# Patient Record
Sex: Female | Born: 2000 | Hispanic: Yes | Marital: Single | Smoking: Never smoker
Health system: Southern US, Community
[De-identification: ages and names within clinical notes are randomized; demographics above are authoritative.]

## PROBLEM LIST (undated history)

## (undated) DIAGNOSIS — D649 Anemia, unspecified: Secondary | ICD-10-CM

## (undated) DIAGNOSIS — Z789 Other specified health status: Secondary | ICD-10-CM

## (undated) HISTORY — PX: APPENDECTOMY: SHX54

## (undated) HISTORY — DX: Other specified health status: Z78.9

---
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
## 2021-03-16 ENCOUNTER — Other Ambulatory Visit: Payer: Self-pay

## 2021-03-16 ENCOUNTER — Ambulatory Visit
Admission: RE | Admit: 2021-03-16 | Discharge: 2021-03-16 | Disposition: A | Discharge status: Home / Self Care | Payer: Medicaid Other | Source: Ambulatory Visit | Attending: Physician Assistant | Admitting: Physician Assistant

## 2021-03-16 VITALS — BP 106/66 | HR 95 | Temp 98.5°F | Resp 18

## 2021-03-16 DIAGNOSIS — Z3201 Encounter for pregnancy test, result positive: Secondary | ICD-10-CM

## 2021-03-16 DIAGNOSIS — Z3A01 Less than 8 weeks gestation of pregnancy: Secondary | ICD-10-CM

## 2021-03-16 LAB — POCT URINALYSIS DIP (MANUAL ENTRY)
Bilirubin, UA: NEGATIVE
Blood, UA: NEGATIVE
Glucose, UA: NEGATIVE mg/dL
Leukocytes, UA: NEGATIVE
Nitrite, UA: NEGATIVE
Protein Ur, POC: NEGATIVE mg/dL
Spec Grav, UA: >=1.03 — AB (ref 1.010–1.025)
Urobilinogen, UA: 0.2 E.U./dL
pH, UA: 6 (ref 5.0–8.0)

## 2021-03-16 LAB — POCT URINE PREGNANCY: Preg Test, Ur: POSITIVE — AB

## 2021-03-16 NOTE — ED Triage Notes (Signed)
Pt c/o lower abdominal cramping and breast tenderness since Monday. States feels like her cycle is coming on but is hasn't. [facility] 02/11/2021.

## 2021-03-16 NOTE — ED Provider Notes (Signed)
[facility]    CSN: [id number] Arrival date & time: 03/16/21  1149      History   Chief Complaint Chief Complaint  Patient presents with   appt 12 - abdominal cramping    HPI [name] is a 20 y.o. female.   Patient here today for pregnancy test. She reports that her period is about 4 days late. She is not on any birth control. She has had some lower abdominal cramping that feels similar to period cramps. She denies any vaginal bleeding.   The history is provided by the patient.   Past Medical History:  Diagnosis Date   Anemia    Medical history non-contributory     Patient Active Problem List   Diagnosis Date Noted   GERD (gastroesophageal reflux disease) 10/21/2020   Environmental allergies 02/18/2018   New onset of headaches 07/03/2017   Failed vision screen 07/03/2017   Appendicitis, acute 02/22/2016   Scoliosis 07/29/2015   Dysmenorrhea in adolescent 07/29/2015    Past Surgical History:  Procedure Laterality Date   APPENDECTOMY     LAPAROSCOPIC APPENDECTOMY N/A 02/22/2016   Procedure: APPENDECTOMY LAPAROSCOPIC;  Surgeon: Leonia Corona, MD;  Location: [facility] OR;  Service: General;  Laterality: N/A;    OB History   No obstetric history on file.      Home Medications    Prior to Admission medications   Medication Sig Start Date End Date Taking? Authorizing Provider  cetirizine (ZYRTEC) 10 MG tablet Take 1 tablet (10 mg total) by mouth daily. 05/02/18   Theadore Nan, MD  ferrous sulfate 325 (65 FE) MG tablet Take 1 tablet (325 mg total) by mouth daily. 02/18/18 03/20/18  Stryffeler, Jonathon Jordan, NP  fluticasone (FLONASE) 50 MCG/ACT nasal spray Place 1 spray into both nostrils daily. 1 spray in each nostril every day 05/02/18   Theadore Nan, MD  omeprazole (PRILOSEC) 20 MG capsule Take 1 capsule (20 mg total) by mouth daily. 10/20/20   Rema Fendt, NP    Family History Family History  Problem Relation Age of Onset    Liver disease Maternal Aunt    Scoliosis Maternal Aunt    Scoliosis Cousin     Social History Social History   Tobacco Use   Smoking status: Never   Smokeless tobacco: Never  Vaping Use   Vaping Use: Never used  Substance Use Topics   Alcohol use: No   Drug use: No     Allergies   Patient has no known allergies.   Review of Systems Review of Systems  Constitutional:  Negative for chills and fever.  Eyes:  Negative for discharge and redness.  Respiratory:  Negative for shortness of breath.   Gastrointestinal:  Positive for abdominal pain. Negative for nausea and vomiting.  Genitourinary:  Positive for menstrual problem, pelvic pain and vaginal discharge. Negative for vaginal bleeding.    Physical Exam Triage Vital Signs ED Triage Vitals [03/16/21 1209]  Enc Vitals Group     BP 106/66     Pulse Rate 95     Resp 18     Temp 98.5 F (36.9 C)     Temp Source Oral     SpO2 98 %     Weight      Height      Head Circumference      Peak Flow      Pain Score 0     Pain Loc      Pain Edu?  Excl. in GC?    No data found.  Updated Vital Signs BP 106/66 (BP Location: Left Arm)   Pulse 95   Temp 98.5 F (36.9 C) (Oral)   Resp 18   LMP 02/11/2021   SpO2 98%      Physical Exam Vitals and nursing note reviewed.  Constitutional:      General: She is not in acute distress.    Appearance: Normal appearance. She is not ill-appearing.  HENT:     Head: Normocephalic and atraumatic.  Eyes:     Conjunctiva/sclera: Conjunctivae normal.  Cardiovascular:     Rate and Rhythm: Normal rate.  Pulmonary:     Effort: Pulmonary effort is normal.  Neurological:     Mental Status: She is alert.  Psychiatric:        Mood and Affect: Mood normal.        Behavior: Behavior normal.        Thought Content: Thought content normal.     UC Treatments / Results  Labs (all labs ordered are listed, but only abnormal results are displayed) Labs Reviewed  POCT URINE  PREGNANCY - Abnormal; Notable for the following components:      Result Value   Preg Test, Ur Positive (*)    All other components within normal limits  POCT URINALYSIS DIP (MANUAL ENTRY) - Abnormal; Notable for the following components:   Ketones, POC UA small (15) (*)    Spec Grav, UA >=1.030 (*)    All other components within normal limits    EKG   Radiology No results found.  Procedures Procedures (including critical care time)  Medications Ordered in UC Medications - No data to display  Initial Impression / Assessment and Plan / UC Course  I have reviewed the triage vital signs and the nursing notes.  Pertinent labs & imaging results that were available during my care of the patient were reviewed by me and considered in my medical decision making (see chart for details).   Advised patient of positive pregnancy test. Recommended follow up with OB in the near future. Patient expresses understanding. Advised ED if pelvic pain worsens as she will likely need ultrasound at that time.   Final Clinical Impressions(s) / UC Diagnoses   Final diagnoses:  Less than [redacted] weeks gestation of pregnancy   Discharge Instructions   None    ED Prescriptions   None    PDMP not reviewed this encounter.   Tomi Bamberger, PA-C 03/16/21 1258
